# Patient Record
Sex: Female | Born: 1945
Health system: Southern US, Community
[De-identification: ages and names within clinical notes are randomized; demographics above are authoritative.]

## PROBLEM LIST (undated history)

## (undated) DIAGNOSIS — I10 Essential (primary) hypertension: Secondary | ICD-10-CM

## (undated) DIAGNOSIS — E119 Type 2 diabetes mellitus without complications: Secondary | ICD-10-CM

## (undated) HISTORY — PX: ABDOMINAL HYSTERECTOMY: SHX81

---

## 2011-08-05 ENCOUNTER — Other Ambulatory Visit: Payer: Self-pay | Admitting: Nephrology

## 2011-08-05 DIAGNOSIS — N183 Chronic kidney disease, stage 3 unspecified: Secondary | ICD-10-CM

## 2011-08-09 ENCOUNTER — Other Ambulatory Visit: Payer: Self-pay

## 2011-08-18 ENCOUNTER — Ambulatory Visit
Admission: RE | Admit: 2011-08-18 | Discharge: 2011-08-18 | Disposition: A | Discharge status: Home / Self Care | Payer: Medicare Other | Source: Ambulatory Visit | Attending: Nephrology | Admitting: Nephrology

## 2011-08-18 DIAGNOSIS — N183 Chronic kidney disease, stage 3 unspecified: Secondary | ICD-10-CM

## 2014-06-19 DIAGNOSIS — N183 Chronic kidney disease, stage 3 (moderate): Secondary | ICD-10-CM | POA: Diagnosis not present

## 2014-06-19 DIAGNOSIS — E1165 Type 2 diabetes mellitus with hyperglycemia: Secondary | ICD-10-CM | POA: Diagnosis not present

## 2014-06-19 DIAGNOSIS — E785 Hyperlipidemia, unspecified: Secondary | ICD-10-CM | POA: Diagnosis not present

## 2014-06-19 DIAGNOSIS — I10 Essential (primary) hypertension: Secondary | ICD-10-CM | POA: Diagnosis not present

## 2014-08-27 DIAGNOSIS — R000 Tachycardia, unspecified: Secondary | ICD-10-CM | POA: Diagnosis not present

## 2014-08-27 DIAGNOSIS — I10 Essential (primary) hypertension: Secondary | ICD-10-CM | POA: Diagnosis not present

## 2014-08-27 DIAGNOSIS — N183 Chronic kidney disease, stage 3 (moderate): Secondary | ICD-10-CM | POA: Diagnosis not present

## 2015-01-27 DIAGNOSIS — E1165 Type 2 diabetes mellitus with hyperglycemia: Secondary | ICD-10-CM | POA: Diagnosis not present

## 2015-01-27 DIAGNOSIS — I10 Essential (primary) hypertension: Secondary | ICD-10-CM | POA: Diagnosis not present

## 2015-01-27 DIAGNOSIS — E785 Hyperlipidemia, unspecified: Secondary | ICD-10-CM | POA: Diagnosis not present

## 2015-01-27 DIAGNOSIS — J45909 Unspecified asthma, uncomplicated: Secondary | ICD-10-CM | POA: Diagnosis not present

## 2015-11-18 DIAGNOSIS — H401130 Primary open-angle glaucoma, bilateral, stage unspecified: Secondary | ICD-10-CM | POA: Diagnosis not present

## 2015-11-19 DIAGNOSIS — R000 Tachycardia, unspecified: Secondary | ICD-10-CM | POA: Diagnosis not present

## 2015-11-19 DIAGNOSIS — N183 Chronic kidney disease, stage 3 (moderate): Secondary | ICD-10-CM | POA: Diagnosis not present

## 2015-11-19 DIAGNOSIS — I10 Essential (primary) hypertension: Secondary | ICD-10-CM | POA: Diagnosis not present

## 2016-01-27 DIAGNOSIS — E785 Hyperlipidemia, unspecified: Secondary | ICD-10-CM | POA: Diagnosis not present

## 2016-01-27 DIAGNOSIS — E1165 Type 2 diabetes mellitus with hyperglycemia: Secondary | ICD-10-CM | POA: Diagnosis not present

## 2016-01-27 DIAGNOSIS — M109 Gout, unspecified: Secondary | ICD-10-CM | POA: Diagnosis not present

## 2016-01-27 DIAGNOSIS — Z1211 Encounter for screening for malignant neoplasm of colon: Secondary | ICD-10-CM | POA: Diagnosis not present

## 2016-01-27 DIAGNOSIS — I10 Essential (primary) hypertension: Secondary | ICD-10-CM | POA: Diagnosis not present

## 2016-02-01 DIAGNOSIS — D210 Benign neoplasm of connective and other soft tissue of head, face and neck: Secondary | ICD-10-CM | POA: Diagnosis not present

## 2016-02-01 DIAGNOSIS — D485 Neoplasm of uncertain behavior of skin: Secondary | ICD-10-CM | POA: Diagnosis not present

## 2016-02-17 DIAGNOSIS — G629 Polyneuropathy, unspecified: Secondary | ICD-10-CM | POA: Diagnosis not present

## 2016-02-17 DIAGNOSIS — R000 Tachycardia, unspecified: Secondary | ICD-10-CM | POA: Diagnosis not present

## 2016-02-17 DIAGNOSIS — I10 Essential (primary) hypertension: Secondary | ICD-10-CM | POA: Diagnosis not present

## 2016-02-17 DIAGNOSIS — N183 Chronic kidney disease, stage 3 (moderate): Secondary | ICD-10-CM | POA: Diagnosis not present

## 2016-05-05 DIAGNOSIS — D485 Neoplasm of uncertain behavior of skin: Secondary | ICD-10-CM | POA: Diagnosis not present

## 2016-05-10 DIAGNOSIS — D2312 Other benign neoplasm of skin of left eyelid, including canthus: Secondary | ICD-10-CM | POA: Diagnosis not present

## 2016-08-18 DIAGNOSIS — R000 Tachycardia, unspecified: Secondary | ICD-10-CM | POA: Diagnosis not present

## 2016-08-18 DIAGNOSIS — N183 Chronic kidney disease, stage 3 (moderate): Secondary | ICD-10-CM | POA: Diagnosis not present

## 2016-08-18 DIAGNOSIS — M109 Gout, unspecified: Secondary | ICD-10-CM | POA: Diagnosis not present

## 2016-08-18 DIAGNOSIS — G629 Polyneuropathy, unspecified: Secondary | ICD-10-CM | POA: Diagnosis not present

## 2016-08-18 DIAGNOSIS — I10 Essential (primary) hypertension: Secondary | ICD-10-CM | POA: Diagnosis not present

## 2016-09-21 DIAGNOSIS — I10 Essential (primary) hypertension: Secondary | ICD-10-CM | POA: Diagnosis not present

## 2016-09-21 DIAGNOSIS — J45909 Unspecified asthma, uncomplicated: Secondary | ICD-10-CM | POA: Diagnosis not present

## 2016-09-21 DIAGNOSIS — Z1389 Encounter for screening for other disorder: Secondary | ICD-10-CM | POA: Diagnosis not present

## 2016-09-21 DIAGNOSIS — Z0001 Encounter for general adult medical examination with abnormal findings: Secondary | ICD-10-CM | POA: Diagnosis not present

## 2016-09-21 DIAGNOSIS — E785 Hyperlipidemia, unspecified: Secondary | ICD-10-CM | POA: Diagnosis not present

## 2016-09-21 DIAGNOSIS — M79602 Pain in left arm: Secondary | ICD-10-CM | POA: Diagnosis not present

## 2016-09-21 DIAGNOSIS — M109 Gout, unspecified: Secondary | ICD-10-CM | POA: Diagnosis not present

## 2016-09-21 DIAGNOSIS — E1165 Type 2 diabetes mellitus with hyperglycemia: Secondary | ICD-10-CM | POA: Diagnosis not present

## 2016-09-21 DIAGNOSIS — N183 Chronic kidney disease, stage 3 (moderate): Secondary | ICD-10-CM | POA: Diagnosis not present

## 2016-09-23 DIAGNOSIS — M25512 Pain in left shoulder: Secondary | ICD-10-CM | POA: Diagnosis not present

## 2016-09-23 DIAGNOSIS — R202 Paresthesia of skin: Secondary | ICD-10-CM | POA: Diagnosis not present

## 2016-10-05 DIAGNOSIS — M4722 Other spondylosis with radiculopathy, cervical region: Secondary | ICD-10-CM | POA: Diagnosis not present

## 2016-10-05 DIAGNOSIS — M25512 Pain in left shoulder: Secondary | ICD-10-CM | POA: Diagnosis not present

## 2016-10-05 DIAGNOSIS — M542 Cervicalgia: Secondary | ICD-10-CM | POA: Diagnosis not present

## 2016-10-06 DIAGNOSIS — M5412 Radiculopathy, cervical region: Secondary | ICD-10-CM | POA: Diagnosis not present

## 2016-10-10 DIAGNOSIS — M5412 Radiculopathy, cervical region: Secondary | ICD-10-CM | POA: Diagnosis not present

## 2016-10-13 DIAGNOSIS — M5412 Radiculopathy, cervical region: Secondary | ICD-10-CM | POA: Diagnosis not present

## 2016-10-18 DIAGNOSIS — M5412 Radiculopathy, cervical region: Secondary | ICD-10-CM | POA: Diagnosis not present

## 2016-10-27 DIAGNOSIS — M5412 Radiculopathy, cervical region: Secondary | ICD-10-CM | POA: Diagnosis not present

## 2016-12-13 DIAGNOSIS — Z780 Asymptomatic menopausal state: Secondary | ICD-10-CM | POA: Diagnosis not present

## 2017-02-24 DIAGNOSIS — H401134 Primary open-angle glaucoma, bilateral, indeterminate stage: Secondary | ICD-10-CM | POA: Diagnosis not present

## 2017-02-27 DIAGNOSIS — N183 Chronic kidney disease, stage 3 (moderate): Secondary | ICD-10-CM | POA: Diagnosis not present

## 2017-02-27 DIAGNOSIS — I10 Essential (primary) hypertension: Secondary | ICD-10-CM | POA: Diagnosis not present

## 2017-02-27 DIAGNOSIS — M109 Gout, unspecified: Secondary | ICD-10-CM | POA: Diagnosis not present

## 2017-02-27 DIAGNOSIS — R000 Tachycardia, unspecified: Secondary | ICD-10-CM | POA: Diagnosis not present

## 2017-02-27 DIAGNOSIS — G629 Polyneuropathy, unspecified: Secondary | ICD-10-CM | POA: Diagnosis not present

## 2017-11-30 DIAGNOSIS — J45909 Unspecified asthma, uncomplicated: Secondary | ICD-10-CM | POA: Diagnosis not present

## 2017-11-30 DIAGNOSIS — I10 Essential (primary) hypertension: Secondary | ICD-10-CM | POA: Diagnosis not present

## 2017-11-30 DIAGNOSIS — E785 Hyperlipidemia, unspecified: Secondary | ICD-10-CM | POA: Diagnosis not present

## 2017-11-30 DIAGNOSIS — M109 Gout, unspecified: Secondary | ICD-10-CM | POA: Diagnosis not present

## 2017-11-30 DIAGNOSIS — Z1211 Encounter for screening for malignant neoplasm of colon: Secondary | ICD-10-CM | POA: Diagnosis not present

## 2017-11-30 DIAGNOSIS — E1169 Type 2 diabetes mellitus with other specified complication: Secondary | ICD-10-CM | POA: Diagnosis not present

## 2017-11-30 DIAGNOSIS — N183 Chronic kidney disease, stage 3 (moderate): Secondary | ICD-10-CM | POA: Diagnosis not present

## 2018-02-06 DIAGNOSIS — A084 Viral intestinal infection, unspecified: Secondary | ICD-10-CM | POA: Diagnosis not present

## 2018-02-06 DIAGNOSIS — R51 Headache: Secondary | ICD-10-CM | POA: Diagnosis not present

## 2018-09-20 DIAGNOSIS — Z961 Presence of intraocular lens: Secondary | ICD-10-CM | POA: Diagnosis not present

## 2018-09-20 DIAGNOSIS — E113393 Type 2 diabetes mellitus with moderate nonproliferative diabetic retinopathy without macular edema, bilateral: Secondary | ICD-10-CM | POA: Diagnosis not present

## 2018-11-13 DIAGNOSIS — E113293 Type 2 diabetes mellitus with mild nonproliferative diabetic retinopathy without macular edema, bilateral: Secondary | ICD-10-CM | POA: Diagnosis not present

## 2018-11-13 DIAGNOSIS — H43811 Vitreous degeneration, right eye: Secondary | ICD-10-CM | POA: Diagnosis not present

## 2018-11-13 DIAGNOSIS — H43823 Vitreomacular adhesion, bilateral: Secondary | ICD-10-CM | POA: Diagnosis not present

## 2018-12-03 DIAGNOSIS — J45909 Unspecified asthma, uncomplicated: Secondary | ICD-10-CM | POA: Diagnosis not present

## 2018-12-03 DIAGNOSIS — M109 Gout, unspecified: Secondary | ICD-10-CM | POA: Diagnosis not present

## 2018-12-03 DIAGNOSIS — Z7984 Long term (current) use of oral hypoglycemic drugs: Secondary | ICD-10-CM | POA: Diagnosis not present

## 2018-12-03 DIAGNOSIS — E113293 Type 2 diabetes mellitus with mild nonproliferative diabetic retinopathy without macular edema, bilateral: Secondary | ICD-10-CM | POA: Diagnosis not present

## 2018-12-03 DIAGNOSIS — E1169 Type 2 diabetes mellitus with other specified complication: Secondary | ICD-10-CM | POA: Diagnosis not present

## 2018-12-03 DIAGNOSIS — I10 Essential (primary) hypertension: Secondary | ICD-10-CM | POA: Diagnosis not present

## 2018-12-03 DIAGNOSIS — N183 Chronic kidney disease, stage 3 (moderate): Secondary | ICD-10-CM | POA: Diagnosis not present

## 2018-12-03 DIAGNOSIS — E785 Hyperlipidemia, unspecified: Secondary | ICD-10-CM | POA: Diagnosis not present

## 2018-12-04 DIAGNOSIS — E785 Hyperlipidemia, unspecified: Secondary | ICD-10-CM | POA: Diagnosis not present

## 2018-12-18 DIAGNOSIS — Z1231 Encounter for screening mammogram for malignant neoplasm of breast: Secondary | ICD-10-CM | POA: Diagnosis not present

## 2018-12-18 DIAGNOSIS — Z0000 Encounter for general adult medical examination without abnormal findings: Secondary | ICD-10-CM | POA: Diagnosis not present

## 2018-12-18 DIAGNOSIS — Z1389 Encounter for screening for other disorder: Secondary | ICD-10-CM | POA: Diagnosis not present

## 2018-12-18 DIAGNOSIS — G4700 Insomnia, unspecified: Secondary | ICD-10-CM | POA: Diagnosis not present

## 2019-04-24 DIAGNOSIS — Z7984 Long term (current) use of oral hypoglycemic drugs: Secondary | ICD-10-CM | POA: Diagnosis not present

## 2019-04-24 DIAGNOSIS — E1165 Type 2 diabetes mellitus with hyperglycemia: Secondary | ICD-10-CM | POA: Diagnosis not present

## 2019-04-24 DIAGNOSIS — I10 Essential (primary) hypertension: Secondary | ICD-10-CM | POA: Diagnosis not present

## 2019-05-09 DIAGNOSIS — H43811 Vitreous degeneration, right eye: Secondary | ICD-10-CM | POA: Diagnosis not present

## 2019-05-13 DIAGNOSIS — J45909 Unspecified asthma, uncomplicated: Secondary | ICD-10-CM | POA: Diagnosis not present

## 2019-05-13 DIAGNOSIS — I10 Essential (primary) hypertension: Secondary | ICD-10-CM | POA: Diagnosis not present

## 2019-05-13 DIAGNOSIS — G4700 Insomnia, unspecified: Secondary | ICD-10-CM | POA: Diagnosis not present

## 2019-05-13 DIAGNOSIS — N1832 Chronic kidney disease, stage 3b: Secondary | ICD-10-CM | POA: Diagnosis not present

## 2019-05-13 DIAGNOSIS — E1169 Type 2 diabetes mellitus with other specified complication: Secondary | ICD-10-CM | POA: Diagnosis not present

## 2019-05-13 DIAGNOSIS — E785 Hyperlipidemia, unspecified: Secondary | ICD-10-CM | POA: Diagnosis not present

## 2019-06-06 DIAGNOSIS — H401131 Primary open-angle glaucoma, bilateral, mild stage: Secondary | ICD-10-CM | POA: Diagnosis not present

## 2019-09-05 DIAGNOSIS — R112 Nausea with vomiting, unspecified: Secondary | ICD-10-CM | POA: Diagnosis not present

## 2019-09-05 DIAGNOSIS — R634 Abnormal weight loss: Secondary | ICD-10-CM | POA: Diagnosis not present

## 2019-09-10 ENCOUNTER — Other Ambulatory Visit: Payer: Self-pay | Admitting: Physician Assistant

## 2019-09-10 DIAGNOSIS — R112 Nausea with vomiting, unspecified: Secondary | ICD-10-CM | POA: Diagnosis not present

## 2019-09-10 DIAGNOSIS — Z1211 Encounter for screening for malignant neoplasm of colon: Secondary | ICD-10-CM | POA: Diagnosis not present

## 2019-09-10 DIAGNOSIS — R945 Abnormal results of liver function studies: Secondary | ICD-10-CM | POA: Diagnosis not present

## 2019-09-10 DIAGNOSIS — R6881 Early satiety: Secondary | ICD-10-CM | POA: Diagnosis not present

## 2019-09-10 DIAGNOSIS — R630 Anorexia: Secondary | ICD-10-CM | POA: Diagnosis not present

## 2019-09-10 DIAGNOSIS — K219 Gastro-esophageal reflux disease without esophagitis: Secondary | ICD-10-CM | POA: Diagnosis not present

## 2019-09-10 DIAGNOSIS — R634 Abnormal weight loss: Secondary | ICD-10-CM | POA: Diagnosis not present

## 2019-09-11 ENCOUNTER — Other Ambulatory Visit: Payer: Self-pay | Admitting: Family Medicine

## 2019-09-11 DIAGNOSIS — Z1231 Encounter for screening mammogram for malignant neoplasm of breast: Secondary | ICD-10-CM

## 2019-09-12 DIAGNOSIS — R945 Abnormal results of liver function studies: Secondary | ICD-10-CM | POA: Diagnosis not present

## 2019-09-12 DIAGNOSIS — Z1159 Encounter for screening for other viral diseases: Secondary | ICD-10-CM | POA: Diagnosis not present

## 2019-09-16 ENCOUNTER — Ambulatory Visit
Admission: RE | Admit: 2019-09-16 | Discharge: 2019-09-16 | Disposition: A | Discharge status: Home / Self Care | Payer: Medicare HMO | Source: Ambulatory Visit | Attending: Physician Assistant | Admitting: Physician Assistant

## 2019-09-16 DIAGNOSIS — K7689 Other specified diseases of liver: Secondary | ICD-10-CM | POA: Diagnosis not present

## 2019-09-16 DIAGNOSIS — R112 Nausea with vomiting, unspecified: Secondary | ICD-10-CM

## 2019-09-17 DIAGNOSIS — K2930 Chronic superficial gastritis without bleeding: Secondary | ICD-10-CM | POA: Diagnosis not present

## 2019-09-17 DIAGNOSIS — K222 Esophageal obstruction: Secondary | ICD-10-CM | POA: Diagnosis not present

## 2019-09-17 DIAGNOSIS — R634 Abnormal weight loss: Secondary | ICD-10-CM | POA: Diagnosis not present

## 2019-09-17 DIAGNOSIS — R112 Nausea with vomiting, unspecified: Secondary | ICD-10-CM | POA: Diagnosis not present

## 2019-09-19 DIAGNOSIS — I10 Essential (primary) hypertension: Secondary | ICD-10-CM | POA: Diagnosis not present

## 2019-09-19 DIAGNOSIS — M109 Gout, unspecified: Secondary | ICD-10-CM | POA: Diagnosis not present

## 2019-09-19 DIAGNOSIS — E113293 Type 2 diabetes mellitus with mild nonproliferative diabetic retinopathy without macular edema, bilateral: Secondary | ICD-10-CM | POA: Diagnosis not present

## 2019-09-19 DIAGNOSIS — E119 Type 2 diabetes mellitus without complications: Secondary | ICD-10-CM | POA: Diagnosis not present

## 2019-09-19 DIAGNOSIS — R1114 Bilious vomiting: Secondary | ICD-10-CM | POA: Diagnosis not present

## 2019-09-19 DIAGNOSIS — R94120 Abnormal auditory function study: Secondary | ICD-10-CM | POA: Diagnosis not present

## 2019-09-19 DIAGNOSIS — K219 Gastro-esophageal reflux disease without esophagitis: Secondary | ICD-10-CM | POA: Diagnosis not present

## 2019-09-19 DIAGNOSIS — E1165 Type 2 diabetes mellitus with hyperglycemia: Secondary | ICD-10-CM | POA: Diagnosis not present

## 2019-09-19 DIAGNOSIS — E785 Hyperlipidemia, unspecified: Secondary | ICD-10-CM | POA: Diagnosis not present

## 2019-09-24 ENCOUNTER — Ambulatory Visit
Admission: RE | Admit: 2019-09-24 | Discharge: 2019-09-24 | Disposition: A | Discharge status: Home / Self Care | Payer: Medicare HMO | Source: Ambulatory Visit | Attending: Family Medicine | Admitting: Family Medicine

## 2019-09-24 ENCOUNTER — Other Ambulatory Visit: Payer: Self-pay

## 2019-09-24 DIAGNOSIS — K2930 Chronic superficial gastritis without bleeding: Secondary | ICD-10-CM | POA: Diagnosis not present

## 2019-09-24 DIAGNOSIS — Z1231 Encounter for screening mammogram for malignant neoplasm of breast: Secondary | ICD-10-CM | POA: Diagnosis not present

## 2019-10-04 DIAGNOSIS — K219 Gastro-esophageal reflux disease without esophagitis: Secondary | ICD-10-CM | POA: Diagnosis not present

## 2019-10-04 DIAGNOSIS — I10 Essential (primary) hypertension: Secondary | ICD-10-CM | POA: Diagnosis not present

## 2019-10-04 DIAGNOSIS — R1114 Bilious vomiting: Secondary | ICD-10-CM | POA: Diagnosis not present

## 2019-10-04 DIAGNOSIS — E119 Type 2 diabetes mellitus without complications: Secondary | ICD-10-CM | POA: Diagnosis not present

## 2019-10-08 DIAGNOSIS — R109 Unspecified abdominal pain: Secondary | ICD-10-CM | POA: Diagnosis not present

## 2019-10-08 DIAGNOSIS — R9389 Abnormal findings on diagnostic imaging of other specified body structures: Secondary | ICD-10-CM | POA: Diagnosis not present

## 2019-10-08 DIAGNOSIS — R7989 Other specified abnormal findings of blood chemistry: Secondary | ICD-10-CM | POA: Diagnosis not present

## 2019-10-08 DIAGNOSIS — R112 Nausea with vomiting, unspecified: Secondary | ICD-10-CM | POA: Diagnosis not present

## 2019-10-11 DIAGNOSIS — R9389 Abnormal findings on diagnostic imaging of other specified body structures: Secondary | ICD-10-CM | POA: Diagnosis not present

## 2019-10-11 DIAGNOSIS — R7989 Other specified abnormal findings of blood chemistry: Secondary | ICD-10-CM | POA: Diagnosis not present

## 2019-10-13 ENCOUNTER — Encounter (HOSPITAL_COMMUNITY): Payer: Self-pay

## 2019-10-13 ENCOUNTER — Emergency Department (HOSPITAL_COMMUNITY)
Admission: EM | Admit: 2019-10-13 | Discharge: 2019-10-13 | Discharge status: Against Medical Advice | Payer: Medicare HMO | Attending: Emergency Medicine | Admitting: Emergency Medicine

## 2019-10-13 ENCOUNTER — Other Ambulatory Visit: Payer: Self-pay

## 2019-10-13 DIAGNOSIS — Z5321 Procedure and treatment not carried out due to patient leaving prior to being seen by health care provider: Secondary | ICD-10-CM | POA: Diagnosis not present

## 2019-10-13 DIAGNOSIS — K5900 Constipation, unspecified: Secondary | ICD-10-CM | POA: Insufficient documentation

## 2019-10-13 HISTORY — DX: Essential (primary) hypertension: I10

## 2019-10-13 HISTORY — DX: Type 2 diabetes mellitus without complications: E11.9

## 2019-10-13 NOTE — ED Triage Notes (Signed)
Pt presents with constipation, LBM 2 days ago, reports her stool is too hard. Taken several OTC remedies with no relief. Daughter gave her an enema before coming here

## 2019-10-13 NOTE — ED Notes (Signed)
Pt states she had good BM while waiting in lobby and no longer needs to be seen.  Encouraged her to return if she needs to be seen again.  Pt thanked me and ambulatory to car with family.  No distress.

## 2019-11-06 DIAGNOSIS — E119 Type 2 diabetes mellitus without complications: Secondary | ICD-10-CM | POA: Diagnosis not present

## 2019-11-08 DIAGNOSIS — K219 Gastro-esophageal reflux disease without esophagitis: Secondary | ICD-10-CM | POA: Diagnosis not present

## 2019-11-08 DIAGNOSIS — E1165 Type 2 diabetes mellitus with hyperglycemia: Secondary | ICD-10-CM | POA: Diagnosis not present

## 2019-11-08 DIAGNOSIS — I10 Essential (primary) hypertension: Secondary | ICD-10-CM | POA: Diagnosis not present

## 2019-11-08 DIAGNOSIS — H6123 Impacted cerumen, bilateral: Secondary | ICD-10-CM | POA: Diagnosis not present

## 2019-11-11 DIAGNOSIS — E119 Type 2 diabetes mellitus without complications: Secondary | ICD-10-CM | POA: Diagnosis not present

## 2019-11-11 DIAGNOSIS — H9311 Tinnitus, right ear: Secondary | ICD-10-CM | POA: Diagnosis not present

## 2019-11-11 DIAGNOSIS — H90A22 Sensorineural hearing loss, unilateral, left ear, with restricted hearing on the contralateral side: Secondary | ICD-10-CM | POA: Diagnosis not present

## 2019-11-11 DIAGNOSIS — H90A31 Mixed conductive and sensorineural hearing loss, unilateral, right ear with restricted hearing on the contralateral side: Secondary | ICD-10-CM | POA: Diagnosis not present

## 2019-11-13 DIAGNOSIS — R112 Nausea with vomiting, unspecified: Secondary | ICD-10-CM | POA: Diagnosis not present

## 2019-11-13 DIAGNOSIS — K3184 Gastroparesis: Secondary | ICD-10-CM | POA: Diagnosis not present

## 2019-11-13 DIAGNOSIS — R9389 Abnormal findings on diagnostic imaging of other specified body structures: Secondary | ICD-10-CM | POA: Diagnosis not present

## 2019-11-13 DIAGNOSIS — R7989 Other specified abnormal findings of blood chemistry: Secondary | ICD-10-CM | POA: Diagnosis not present

## 2019-12-20 DIAGNOSIS — K219 Gastro-esophageal reflux disease without esophagitis: Secondary | ICD-10-CM | POA: Diagnosis not present

## 2019-12-20 DIAGNOSIS — E1165 Type 2 diabetes mellitus with hyperglycemia: Secondary | ICD-10-CM | POA: Diagnosis not present

## 2019-12-20 DIAGNOSIS — I10 Essential (primary) hypertension: Secondary | ICD-10-CM | POA: Diagnosis not present

## 2020-01-29 DIAGNOSIS — R059 Cough, unspecified: Secondary | ICD-10-CM | POA: Diagnosis not present

## 2020-01-29 DIAGNOSIS — I10 Essential (primary) hypertension: Secondary | ICD-10-CM | POA: Diagnosis not present

## 2020-03-13 DIAGNOSIS — Z0000 Encounter for general adult medical examination without abnormal findings: Secondary | ICD-10-CM | POA: Diagnosis not present

## 2020-03-13 DIAGNOSIS — I10 Essential (primary) hypertension: Secondary | ICD-10-CM | POA: Diagnosis not present

## 2020-03-13 DIAGNOSIS — E2839 Other primary ovarian failure: Secondary | ICD-10-CM | POA: Diagnosis not present

## 2020-03-13 DIAGNOSIS — N1832 Chronic kidney disease, stage 3b: Secondary | ICD-10-CM | POA: Diagnosis not present

## 2020-03-13 DIAGNOSIS — E1165 Type 2 diabetes mellitus with hyperglycemia: Secondary | ICD-10-CM | POA: Diagnosis not present

## 2020-03-18 DIAGNOSIS — H401131 Primary open-angle glaucoma, bilateral, mild stage: Secondary | ICD-10-CM | POA: Diagnosis not present

## 2020-10-16 LAB — EXTERNAL GENERIC LAB PROCEDURE: COLOGUARD: NEGATIVE

## 2021-03-01 ENCOUNTER — Other Ambulatory Visit: Payer: Self-pay | Admitting: Nephrology

## 2021-03-01 DIAGNOSIS — N1832 Chronic kidney disease, stage 3b: Secondary | ICD-10-CM

## 2021-03-03 IMAGING — US US ABDOMEN LIMITED
1 series · 14 of 25 positions shown · non-contrast
Comparison: None.

CLINICAL DATA: Nausea and vomiting.

EXAM:
ULTRASOUND ABDOMEN LIMITED RIGHT UPPER QUADRANT

[Series 1: us abdomen limited · 0.17mm/px · 14 of 45 slices shown]
[im 1/45]
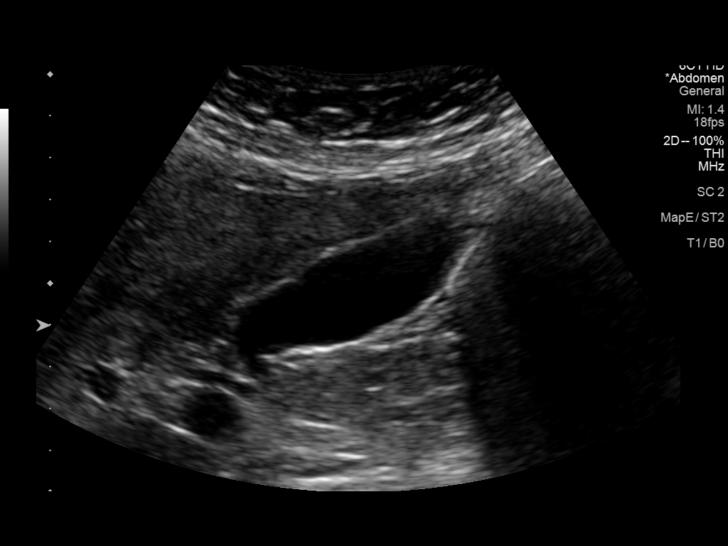
[im 4/45]
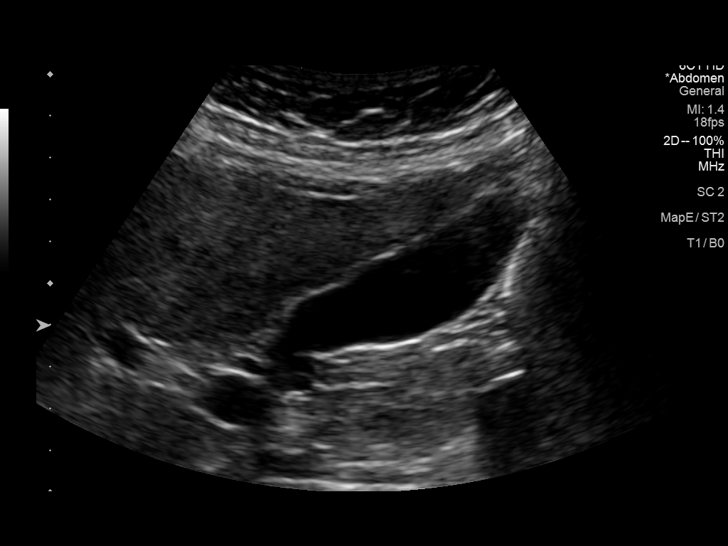
[im 8/45]
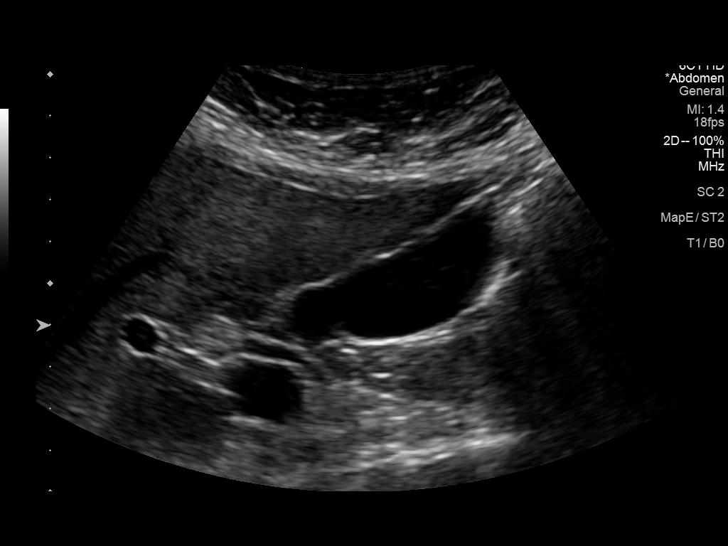
[im 12/45]
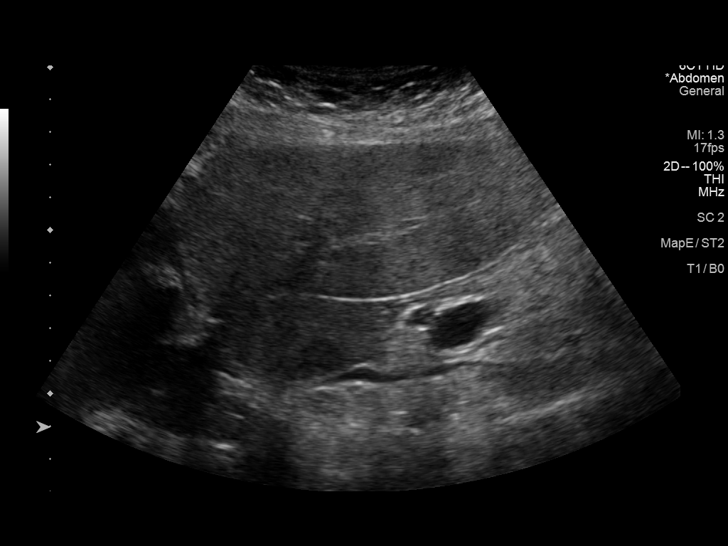
[im 15/45]
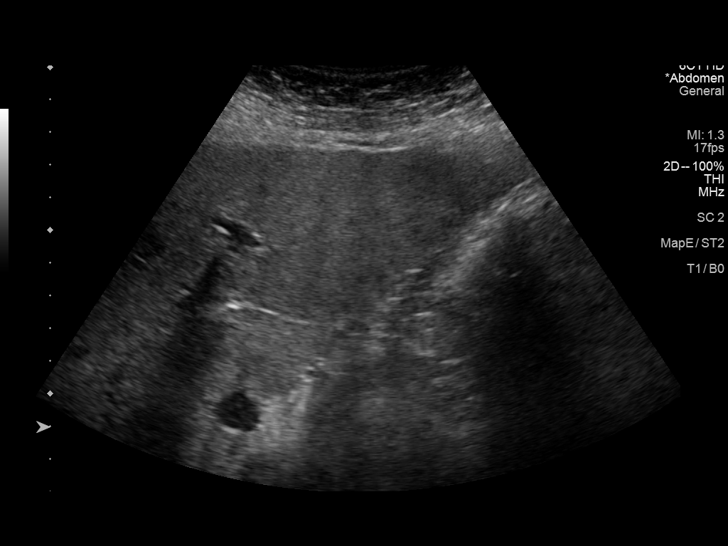
[im 17/45]
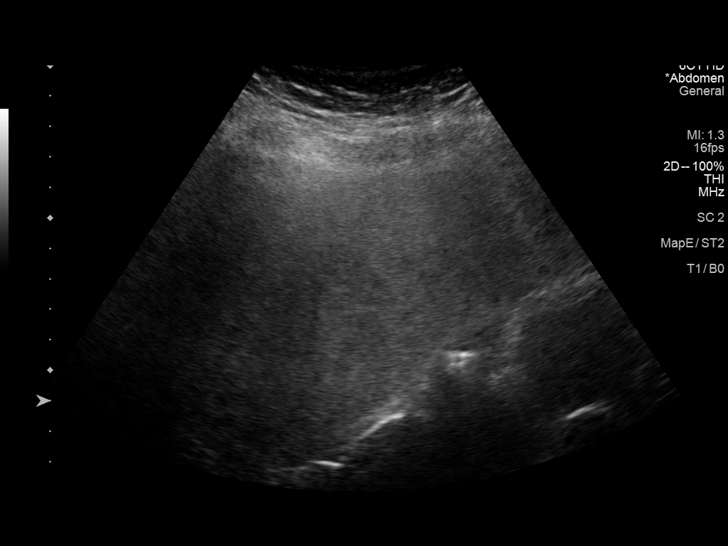
[im 21/45]
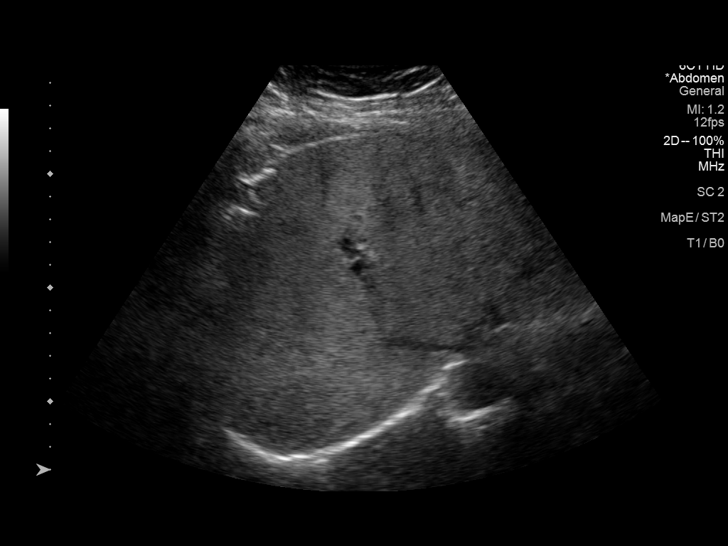
[im 24/45]
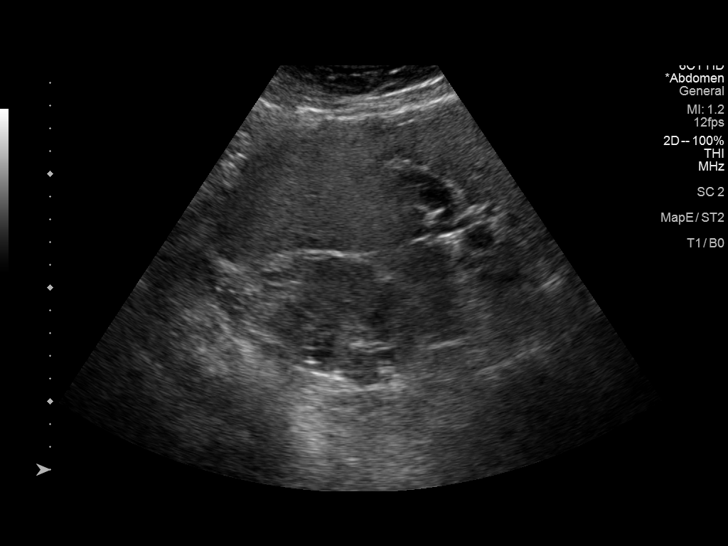
[im 28/45]
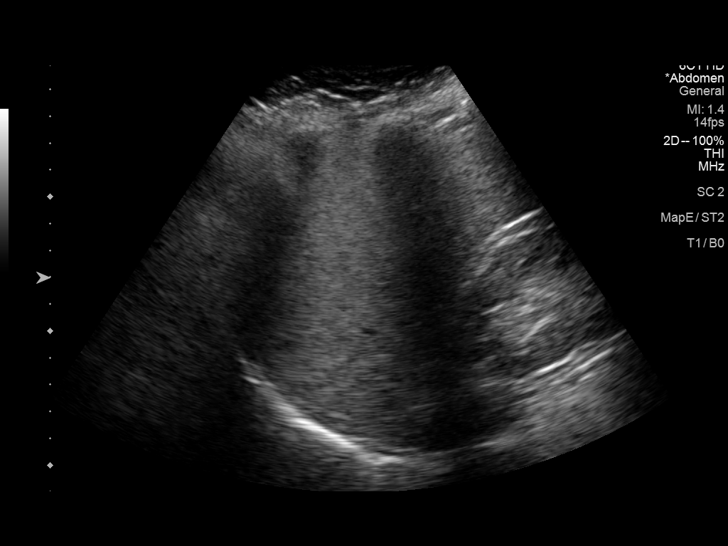
[im 30/45]
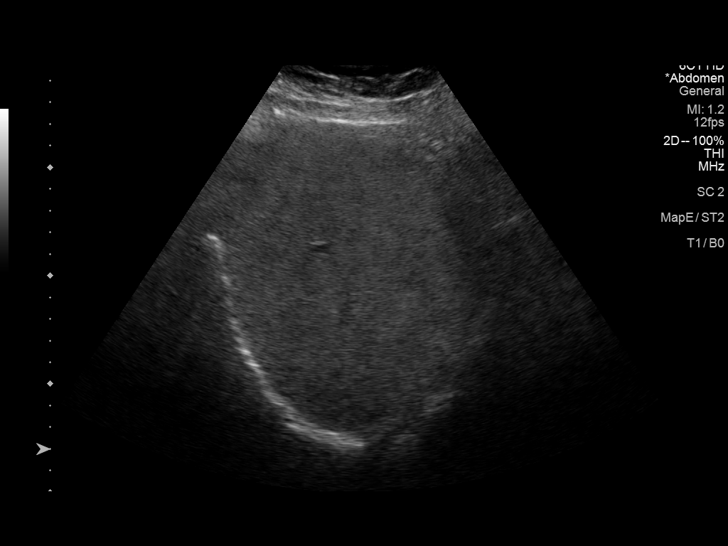
[im 34/45]
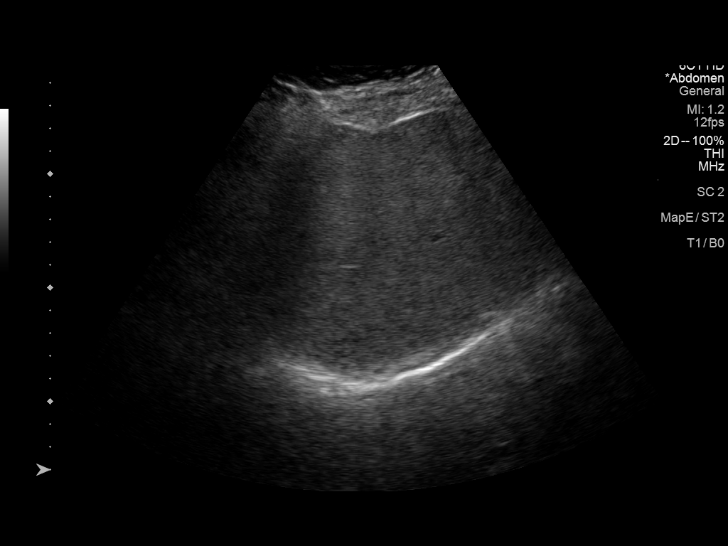
[im 37/45]
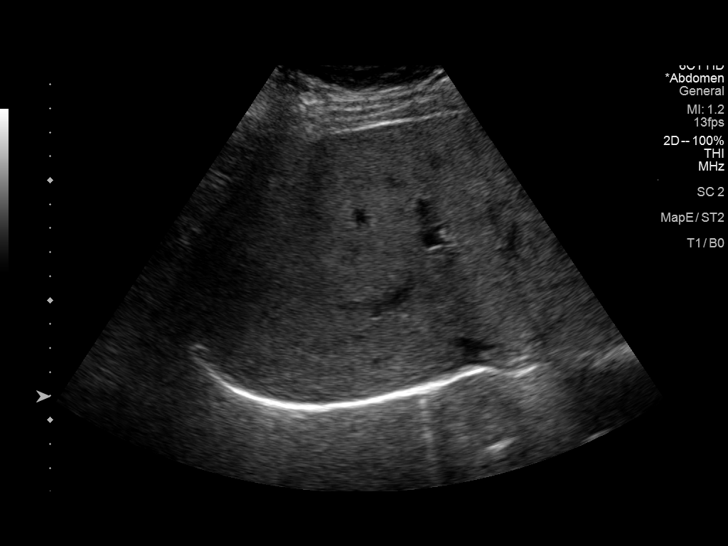
[im 41/45]
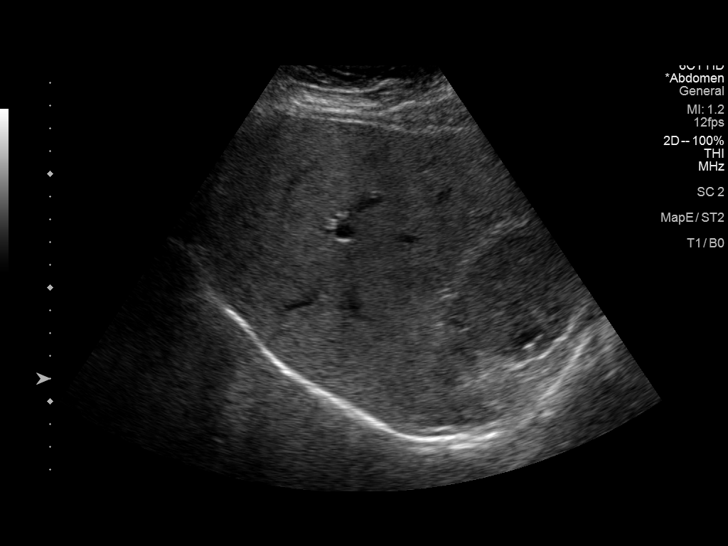
[im 45/45]
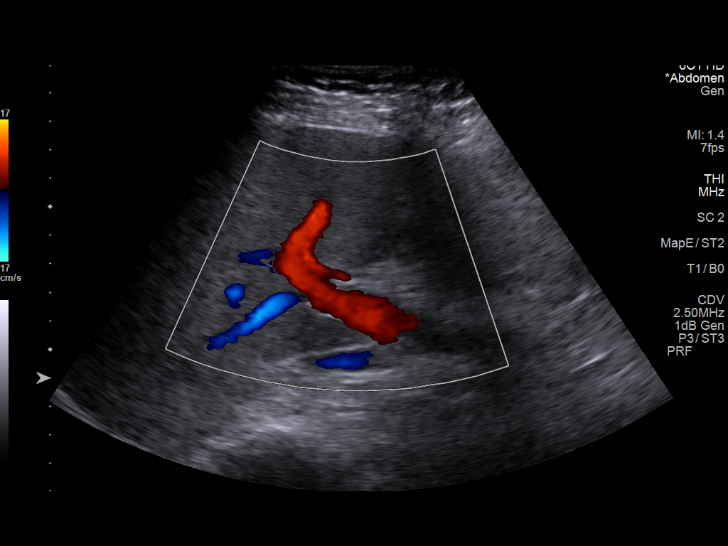

[14 of 25 positions shown; findings below may reference images not displayed]

FINDINGS: Gallbladder:

No gallstones or wall thickening visualized. No sonographic Murphy
sign noted by sonographer.

Common bile duct:

Diameter: Normal, 3 mm.

Liver:

Heterogeneously increased hepatic echogenicity. Portal vein is
patent on color Doppler imaging with normal direction of blood flow
towards the liver.

Other: None.
IMPRESSION: 1.  No acute process or explanation for nausea/vomiting.
2. Heterogeneous hepatic echogenicity, most likely related to
heterogeneous steatosis. Consider further evaluation with pre and
post contrast abdominal MRI, to exclude liver disease, including
cirrhosis or indeterminate liver lesions.

## 2021-03-12 ENCOUNTER — Ambulatory Visit
Admission: RE | Admit: 2021-03-12 | Discharge: 2021-03-12 | Disposition: A | Discharge status: Home / Self Care | Payer: Medicare HMO | Source: Ambulatory Visit | Attending: Nephrology | Admitting: Nephrology

## 2021-03-12 DIAGNOSIS — N1832 Chronic kidney disease, stage 3b: Secondary | ICD-10-CM

## 2021-04-24 ENCOUNTER — Emergency Department (HOSPITAL_BASED_OUTPATIENT_CLINIC_OR_DEPARTMENT_OTHER): Payer: Medicare HMO | Admitting: Radiology

## 2021-04-24 ENCOUNTER — Other Ambulatory Visit: Payer: Self-pay

## 2021-04-24 ENCOUNTER — Emergency Department (HOSPITAL_BASED_OUTPATIENT_CLINIC_OR_DEPARTMENT_OTHER)
Admission: EM | Admit: 2021-04-24 | Discharge: 2021-04-24 | Disposition: A | Discharge status: Home / Self Care | Payer: Medicare HMO | Attending: Emergency Medicine | Admitting: Emergency Medicine

## 2021-04-24 ENCOUNTER — Encounter (HOSPITAL_BASED_OUTPATIENT_CLINIC_OR_DEPARTMENT_OTHER): Payer: Self-pay | Admitting: Emergency Medicine

## 2021-04-24 DIAGNOSIS — I10 Essential (primary) hypertension: Secondary | ICD-10-CM | POA: Insufficient documentation

## 2021-04-24 DIAGNOSIS — M25561 Pain in right knee: Secondary | ICD-10-CM

## 2021-04-24 DIAGNOSIS — Y92410 Unspecified street and highway as the place of occurrence of the external cause: Secondary | ICD-10-CM | POA: Diagnosis not present

## 2021-04-24 DIAGNOSIS — E119 Type 2 diabetes mellitus without complications: Secondary | ICD-10-CM | POA: Diagnosis not present

## 2021-04-24 DIAGNOSIS — G8911 Acute pain due to trauma: Secondary | ICD-10-CM | POA: Insufficient documentation

## 2021-04-24 MED ORDER — DICLOFENAC SODIUM 1 % EX GEL: 4.0000 g | Freq: Four times a day (QID) | CUTANEOUS | 0 refills | Status: AC

## 2021-04-24 MED ORDER — DICLOFENAC SODIUM 1 % EX GEL: 4.0000 g | Freq: Four times a day (QID) | CUTANEOUS | 0 refills | Status: DC

## 2021-04-24 MED ORDER — OXYCODONE HCL 5 MG PO TABS
2.5000 mg | ORAL_TABLET | Freq: Once | ORAL | Status: AC
  Administered 2021-04-24: 2.5 mg via ORAL
  Filled 2021-04-24: qty 1

## 2021-04-24 MED ORDER — ACETAMINOPHEN 500 MG PO TABS
1000.0000 mg | ORAL_TABLET | Freq: Once | ORAL | Status: AC
  Administered 2021-04-24: 1000 mg via ORAL
  Filled 2021-04-24: qty 2
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
## 2021-04-24 NOTE — ED Notes (Signed)
EDP at bedside  

## 2021-04-24 NOTE — Discharge Instructions (Signed)
Use the gel as prescribed.   Also take tylenol 1000mg (2 extra strength) four times a day.     Your xrays did not show a fracture.

## 2023-08-16 ENCOUNTER — Emergency Department (HOSPITAL_BASED_OUTPATIENT_CLINIC_OR_DEPARTMENT_OTHER)
Admission: EM | Admit: 2023-08-16 | Discharge: 2023-08-16 | Disposition: A | Discharge status: Home / Self Care | Attending: Emergency Medicine | Admitting: Emergency Medicine

## 2023-08-16 ENCOUNTER — Emergency Department (HOSPITAL_BASED_OUTPATIENT_CLINIC_OR_DEPARTMENT_OTHER)

## 2023-08-16 ENCOUNTER — Emergency Department (HOSPITAL_BASED_OUTPATIENT_CLINIC_OR_DEPARTMENT_OTHER): Admitting: Radiology

## 2023-08-16 ENCOUNTER — Other Ambulatory Visit: Payer: Self-pay

## 2023-08-16 DIAGNOSIS — I10 Essential (primary) hypertension: Secondary | ICD-10-CM

## 2023-08-16 DIAGNOSIS — N184 Chronic kidney disease, stage 4 (severe): Secondary | ICD-10-CM | POA: Diagnosis not present

## 2023-08-16 DIAGNOSIS — N281 Cyst of kidney, acquired: Secondary | ICD-10-CM | POA: Diagnosis not present

## 2023-08-16 DIAGNOSIS — E1122 Type 2 diabetes mellitus with diabetic chronic kidney disease: Secondary | ICD-10-CM | POA: Insufficient documentation

## 2023-08-16 DIAGNOSIS — N200 Calculus of kidney: Secondary | ICD-10-CM | POA: Diagnosis not present

## 2023-08-16 DIAGNOSIS — R7989 Other specified abnormal findings of blood chemistry: Secondary | ICD-10-CM | POA: Diagnosis not present

## 2023-08-16 DIAGNOSIS — R112 Nausea with vomiting, unspecified: Secondary | ICD-10-CM | POA: Diagnosis present

## 2023-08-16 DIAGNOSIS — I129 Hypertensive chronic kidney disease with stage 1 through stage 4 chronic kidney disease, or unspecified chronic kidney disease: Secondary | ICD-10-CM | POA: Insufficient documentation

## 2023-08-16 DIAGNOSIS — R0602 Shortness of breath: Secondary | ICD-10-CM | POA: Diagnosis present

## 2023-08-16 LAB — CBG MONITORING, ED: Glucose-Capillary: 210 mg/dL — ABNORMAL HIGH (ref 70–99)

## 2023-08-16 LAB — CBC WITH DIFFERENTIAL/PLATELET
Abs Immature Granulocytes: 0.03 10*3/uL (ref 0.00–0.07)
Basophils Absolute: 0.1 10*3/uL (ref 0.0–0.1)
Basophils Relative: 1 %
Eosinophils Absolute: 3.7 10*3/uL — ABNORMAL HIGH (ref 0.0–0.5)
Eosinophils Relative: 38 %
HCT: 41.8 % (ref 36.0–46.0)
Hemoglobin: 14.3 g/dL (ref 12.0–15.0)
Immature Granulocytes: 0 %
Lymphocytes Relative: 21 %
Lymphs Abs: 2 10*3/uL (ref 0.7–4.0)
MCH: 25.8 pg — ABNORMAL LOW (ref 26.0–34.0)
MCHC: 34.2 g/dL (ref 30.0–36.0)
MCV: 75.5 fL — ABNORMAL LOW (ref 80.0–100.0)
Monocytes Absolute: 0.8 10*3/uL (ref 0.1–1.0)
Monocytes Relative: 8 %
Neutro Abs: 3.1 10*3/uL (ref 1.7–7.7)
Neutrophils Relative %: 32 %
Platelets: 321 10*3/uL (ref 150–400)
RBC: 5.54 MIL/uL — ABNORMAL HIGH (ref 3.87–5.11)
RDW: 15.9 % — ABNORMAL HIGH (ref 11.5–15.5)
WBC: 9.7 10*3/uL (ref 4.0–10.5)
nRBC: 0 % (ref 0.0–0.2)

## 2023-08-16 LAB — COMPREHENSIVE METABOLIC PANEL WITH GFR
ALT: 41 U/L (ref 0–44)
AST: 35 U/L (ref 15–41)
Albumin: 4.8 g/dL (ref 3.5–5.0)
Alkaline Phosphatase: 86 U/L (ref 38–126)
Anion gap: 19 — ABNORMAL HIGH (ref 5–15)
BUN: 33 mg/dL — ABNORMAL HIGH (ref 8–23)
CO2: 21 mmol/L — ABNORMAL LOW (ref 22–32)
Calcium: 10.6 mg/dL — ABNORMAL HIGH (ref 8.9–10.3)
Chloride: 100 mmol/L (ref 98–111)
Creatinine, Ser: 1.99 mg/dL — ABNORMAL HIGH (ref 0.44–1.00)
GFR, Estimated: 25 mL/min — ABNORMAL LOW (ref >60)
Glucose, Bld: 230 mg/dL — ABNORMAL HIGH (ref 70–99)
Potassium: 3.6 mmol/L (ref 3.5–5.1)
Sodium: 140 mmol/L (ref 135–145)
Total Bilirubin: 0.5 mg/dL (ref 0.0–1.2)
Total Protein: 8.5 g/dL — ABNORMAL HIGH (ref 6.5–8.1)

## 2023-08-16 LAB — I-STAT VENOUS BLOOD GAS, ED
Acid-base deficit: 2 mmol/L (ref 0.0–2.0)
Bicarbonate: 22.7 mmol/L (ref 20.0–28.0)
Calcium, Ion: 1.24 mmol/L (ref 1.15–1.40)
HCT: 41 % (ref 36.0–46.0)
Hemoglobin: 13.9 g/dL (ref 12.0–15.0)
O2 Saturation: 85 %
Patient temperature: 98.6
Potassium: 3.4 mmol/L — ABNORMAL LOW (ref 3.5–5.1)
Sodium: 141 mmol/L (ref 135–145)
TCO2: 24 mmol/L (ref 22–32)
pCO2, Ven: 39.2 mmHg — ABNORMAL LOW (ref 44–60)
pH, Ven: 7.37 (ref 7.25–7.43)
pO2, Ven: 52 mmHg — ABNORMAL HIGH (ref 32–45)

## 2023-08-16 LAB — LACTIC ACID, PLASMA: Lactic Acid, Venous: 1.8 mmol/L (ref 0.5–1.9)

## 2023-08-16 LAB — URINALYSIS, ROUTINE W REFLEX MICROSCOPIC
Bacteria, UA: NONE SEEN
Bilirubin Urine: NEGATIVE
Glucose, UA: >1000 mg/dL — AB
Nitrite: NEGATIVE
Protein, ur: >300 mg/dL — AB
Specific Gravity, Urine: 1.029 (ref 1.005–1.030)
pH: 6.5 (ref 5.0–8.0)

## 2023-08-16 LAB — PRO BRAIN NATRIURETIC PEPTIDE: Pro Brain Natriuretic Peptide: 916 pg/mL — ABNORMAL HIGH (ref <300.0)

## 2023-08-16 LAB — BETA-HYDROXYBUTYRIC ACID: Beta-Hydroxybutyric Acid: 1.11 mmol/L — ABNORMAL HIGH (ref 0.05–0.27)

## 2023-08-16 LAB — TROPONIN T, HIGH SENSITIVITY
Troponin T High Sensitivity: 20 ng/L — ABNORMAL HIGH (ref <19)
Troponin T High Sensitivity: 19 ng/L — ABNORMAL HIGH

## 2023-08-16 LAB — LIPASE, BLOOD: Lipase: 39 U/L (ref 11–51)

## 2023-08-16 MED ORDER — DIPHENHYDRAMINE HCL 50 MG/ML IJ SOLN
25.0000 mg | Freq: Once | INTRAMUSCULAR | Status: AC
  Administered 2023-08-16: 25 mg via INTRAVENOUS
  Filled 2023-08-16: qty 1

## 2023-08-16 MED ORDER — METOCLOPRAMIDE HCL 5 MG/ML IJ SOLN
10.0000 mg | Freq: Once | INTRAMUSCULAR | Status: AC
  Administered 2023-08-16: 10 mg via INTRAVENOUS
  Filled 2023-08-16: qty 2

## 2023-08-16 MED ORDER — METOPROLOL SUCCINATE ER 25 MG PO TB24
100.0000 mg | ORAL_TABLET | Freq: Every day | ORAL | Status: DC
  Administered 2023-08-16: 100 mg via ORAL
  Filled 2023-08-16: qty 4

## 2023-08-16 MED ORDER — SODIUM CHLORIDE 0.9 % IV BOLUS
500.0000 mL | Freq: Once | INTRAVENOUS | Status: AC
Start: 2023-08-16 — End: 2023-08-16
  Administered 2023-08-16: 500 mL via INTRAVENOUS

## 2023-08-16 MED ORDER — METOCLOPRAMIDE HCL 10 MG PO TABS: 10.0000 mg | ORAL_TABLET | Freq: Four times a day (QID) | ORAL | 0 refills | Status: AC | PRN

## 2023-08-16 MED ORDER — LABETALOL HCL 5 MG/ML IV SOLN
10.0000 mg | Freq: Once | INTRAVENOUS | Status: AC
  Administered 2023-08-16: 10 mg via INTRAVENOUS
  Filled 2023-08-16: qty 4

## 2023-08-16 MED ORDER — ONDANSETRON HCL 4 MG/2ML IJ SOLN
4.0000 mg | Freq: Once | INTRAMUSCULAR | Status: AC
  Administered 2023-08-16: 4 mg via INTRAVENOUS
  Filled 2023-08-16: qty 2

## 2023-08-16 MED ORDER — AMLODIPINE BESYLATE 5 MG PO TABS
10.0000 mg | ORAL_TABLET | Freq: Once | ORAL | Status: AC
Start: 2023-08-16 — End: 2023-08-16
  Administered 2023-08-16: 10 mg via ORAL
  Filled 2023-08-16: qty 2

## 2023-08-16 NOTE — ED Triage Notes (Signed)
 In for eval of cold symptoms then started vomiting approx 2 days ago. Unable to keep food or water down. Denies abd pain or diarrhea.

## 2023-08-16 NOTE — ED Notes (Addendum)
 Pt ambulated to restroom by NT, pt accidentally removed IV in restroom. IV site bleeding, gauze and coban applied. Pt given PO meds with water, immediately vomited after admin. EDP notified.

## 2023-08-16 NOTE — ED Notes (Signed)
 Pt tolerating PO challenge appropriately at this time.

## 2023-08-16 NOTE — ED Notes (Signed)
 Patient refuses to have fall band placed or fall sign on the door.

## 2023-08-16 NOTE — ED Notes (Signed)
 RN called lab to confirm Troponin could be added to 930 blood draw.

## 2023-08-16 NOTE — ED Notes (Signed)
Spoke with lab about Troponin.

## 2023-08-16 NOTE — ED Provider Notes (Signed)
 [place] EMERGENCY DEPARTMENT AT [facility] Provider Note   CSN: [id number] Arrival date & time: 08/16/23  0734     History  Chief Complaint  Patient presents with   Emesis    Brooke Foster is a 78 y.o. female.  HPI     78 year old female with a history of type 2 diabetes, hypertension, CKD stage IV, hyperlipidemia who presents with concerns for nausea and vomiting.  Reports for the last 2 days she has had nausea and vomiting.  She initially had diarrhea 5 or 6 times on Monday, but since then has not been able to keep anything down and has not had any bowel movements.  She has passed flatus.  She denies any significant abdominal pain, just reports nausea, vomiting, inability to eat or drink anything or take her regular medications.  Denies fever, dysuria, head trauma or headache, chest pain, cough.  She has had some shortness of breath.  Her daughter does give her a medication at night that might contain THC to help her sleep.  She does not take NSAIDs or drink alcohol.    Has had prior gastroenterology follow-up for elevated liver enzymes with ultrasound showing possible cirrhosis, hepatitis panel was negative.  EGD in outside practice in May 2021 showed a Schatzki's ring and hiatal hernia with food in the stomach, was seen in the GI clinic in 2021 with ongoing nausea, vomiting secondary to gastroparesis and was seen again November 2020 for for concern of worsening nausea and vomiting. Past Medical History:  Diagnosis Date   Diabetes mellitus without complication (HCC)    Hypertension      Home Medications Prior to Admission medications   Medication Sig Start Date End Date Taking? Authorizing Provider  metoCLOPramide  (REGLAN ) 10 MG tablet Take 1 tablet (10 mg total) by mouth every 6 (six) hours as needed for nausea or vomiting. 08/16/23  Yes Scarlette Currier, MD  diclofenac  Sodium (VOLTAREN ) 1 % GEL Apply 4 g topically 4 (four) times daily. 04/24/21   Albertus Hughs, DO       Allergies    Allopurinol    Review of Systems   Review of Systems  Physical Exam Updated Vital Signs BP (!) 179/85   Pulse 96   Temp 98.7 F (37.1 C)   Resp 16   Ht 5\' 3"  (1.6 m)   Wt 72.6 kg   SpO2 99%   BMI 28.34 kg/m  Physical Exam Vitals and nursing note reviewed.  Constitutional:      General: She is not in acute distress.    Appearance: She is well-developed. She is not diaphoretic.  HENT:     Head: Normocephalic and atraumatic.  Eyes:     Conjunctiva/sclera: Conjunctivae normal.  Cardiovascular:     Rate and Rhythm: Normal rate and regular rhythm.     Heart sounds: Normal heart sounds. No murmur heard.    No friction rub. No gallop.  Pulmonary:     Effort: Pulmonary effort is normal. No respiratory distress.     Breath sounds: Normal breath sounds. No wheezing or rales.  Abdominal:     General: There is no distension.     Palpations: Abdomen is soft.     Tenderness: There is no abdominal tenderness. There is no guarding.  Musculoskeletal:        General: No tenderness.     Cervical back: Normal range of motion.  Skin:    General: Skin is warm and dry.     Findings:  No erythema or rash.  Neurological:     Mental Status: She is alert and oriented to person, place, and time.     ED Results / Procedures / Treatments   Labs (all labs ordered are listed, but only abnormal results are displayed) Labs Reviewed  CBC WITH DIFFERENTIAL/PLATELET - Abnormal; Notable for the following components:      Result Value   RBC 5.54 (*)    MCV 75.5 (*)    MCH 25.8 (*)    RDW 15.9 (*)    Eosinophils Absolute 3.7 (*)    All other components within normal limits  URINALYSIS, ROUTINE W REFLEX MICROSCOPIC - Abnormal; Notable for the following components:   Glucose, UA >1,000 (*)    Hgb urine dipstick MODERATE (*)    Ketones, ur TRACE (*)    Protein, ur >300 (*)    Leukocytes,Ua TRACE (*)    All other components within normal limits  BETA-HYDROXYBUTYRIC ACID -  Abnormal; Notable for the following components:   Beta-Hydroxybutyric Acid 1.11 (*)    All other components within normal limits  COMPREHENSIVE METABOLIC PANEL WITH GFR - Abnormal; Notable for the following components:   CO2 21 (*)    Glucose, Bld 230 (*)    BUN 33 (*)    Creatinine, Ser 1.99 (*)    Calcium 10.6 (*)    Total Protein 8.5 (*)    GFR, Estimated 25 (*)    Anion gap 19 (*)    All other components within normal limits  PRO BRAIN NATRIURETIC PEPTIDE - Abnormal; Notable for the following components:   Pro Brain Natriuretic Peptide 916.0 (*)    All other components within normal limits  I-STAT VENOUS BLOOD GAS, ED - Abnormal; Notable for the following components:   pCO2, Ven 39.2 (*)    pO2, Ven 52 (*)    Potassium 3.4 (*)    All other components within normal limits  CBG MONITORING, ED - Abnormal; Notable for the following components:   Glucose-Capillary 210 (*)    All other components within normal limits  TROPONIN T, HIGH SENSITIVITY - Abnormal; Notable for the following components:   Troponin T High Sensitivity 20 (*)    All other components within normal limits  TROPONIN T, HIGH SENSITIVITY - Abnormal; Notable for the following components:   Troponin T High Sensitivity 19 (*)    All other components within normal limits  URINE CULTURE  LACTIC ACID, PLASMA  LIPASE, BLOOD  I-STAT VENOUS BLOOD GAS, ED    EKG EKG Interpretation Date/Time:  Wednesday August 16 2023 10:32:56 EDT Ventricular Rate:  99 PR Interval:  153 QRS Duration:  87 QT Interval:  341 QTC Calculation: 438 R Axis:   73  Text Interpretation: Sinus rhythm Consider left atrial enlargement Consider left ventricular hypertrophy Baseline wander in lead(s) V2 No previous ECGs available Confirmed by Scarlette Currier ([id number]) on 08/16/2023 11:43:32 AM  Radiology CT ABDOMEN PELVIS WO CONTRAST Result Date: 08/16/2023 CLINICAL DATA:  Suspected bowel obstruction EXAM: CT ABDOMEN AND PELVIS WITHOUT CONTRAST  TECHNIQUE: Multidetector CT imaging of the abdomen and pelvis was performed following the standard protocol without IV contrast. RADIATION DOSE REDUCTION: This exam was performed according to the departmental dose-optimization program which includes automated exposure control, adjustment of the mA and/or kV according to patient size and/or use of iterative reconstruction technique. COMPARISON:  None Available. FINDINGS: Lower chest: No infiltrates or consolidations, no pleural effusions Hepatobiliary: Liver normal size no masses no biliary dilatation. Gallbladder unremarkable.  No gallstones. Pancreas: Pancreas normal size. No masses calcifications or inflammatory changes. Spleen: Spleen normal size.  No masses. Adrenals/Urinary Tract: Adrenal glands are normal size. Follow-up recommended. Kidneys are normal. No masses. No hydronephrosis. Several cortical cysts bilaterally. More dominant right kidney upper pole simple cyst of 1.8 x 1.8 cm. Anterior cortical margin right kidney simple cyst of 10 x 10 mm Anterior cortical margin of the lower pole of the right kidney simple cortical cyst of 4.3 x 3.6 mm. 2 mm calcification in the right kidney upper pole calices correlate with lithiasis without hydronephrosis. Stomach/Bowel: No small or large bowel obstruction or inflammatory changes. Moderate amount of residual fecal material throughout the colon without obstruction or constipation. Vascular/Lymphatic: No significant vascular findings are present. No enlarged abdominal or pelvic lymph nodes. Reproductive: Status post hysterectomy. No adnexal masses. Other: Anterior abdominal wall unremarkable without evidence of umbilical or inguinal hernias Musculoskeletal: Visualized portion of the thoracolumbar spine and pelvic structures grossly unremarkable without evidence of fracture bony abnormalities or soft tissue masses. IMPRESSION: *No evidence of bowel obstruction or inflammatory changes. *Moderate amount of residual fecal  material throughout the colon without obstruction or constipation. *Right kidney upper pole simple cyst of 1.8 x 1.8 cm. Anterior cortical margin right kidney simple cyst of 10 x 10 mm. Anterior cortical margin of the lower pole of the right kidney simple cortical cyst of 4.3 x 3.6 mm. 2 mm calcification in the right kidney upper pole calices correlate with lithiasis without hydronephrosis. Electronically Signed   By: Fredrich Jefferson M.D.   On: 08/16/2023 11:58   DG Chest 2 View Result Date: 08/16/2023 CLINICAL DATA:  Shortness of breath EXAM: CHEST - 2 VIEW COMPARISON:  None Available. FINDINGS: Heart and mediastinal contours are within normal limits. No focal opacities or effusions. No acute bony abnormality. Aortic atherosclerosis. IMPRESSION: No active cardiopulmonary disease. Electronically Signed   By: Janeece Mechanic M.D.   On: 08/16/2023 10:22    Procedures Procedures    Medications Ordered in ED Medications  labetalol  (NORMODYNE ) injection 10 mg (10 mg Intravenous Given 08/16/23 1024)  sodium chloride  0.9 % bolus 500 mL (0 mLs Intravenous Stopped 08/16/23 1204)  ondansetron  (ZOFRAN ) injection 4 mg (4 mg Intravenous Given 08/16/23 1024)  amLODipine  (NORVASC ) tablet 10 mg (10 mg Oral Given 08/16/23 1231)  metoCLOPramide  (REGLAN ) injection 10 mg (10 mg Intravenous Given 08/16/23 1323)  diphenhydrAMINE  (BENADRYL ) injection 25 mg (25 mg Intravenous Given 08/16/23 1320)  labetalol  (NORMODYNE ) injection 10 mg (10 mg Intravenous Given 08/16/23 1327)    ED Course/ Medical Decision Making/ A&P                                    78 year old female with a history of type 2 diabetes, hypertension, CKD stage IV, hyperlipidemia who presents with concerns for nausea and vomiting.  Differential diagnosis includes small bowel obstruction, pancreatitis, hepatitis, cholecystitis, colonic obstruction, DKA, urinary tract infection, ACS, CHF.  EKG completed and personally eval and interpreted by me shows a  normal sinus rhythm without significant acute ST changes.  Chest x-ray completed and evaluated by me show no evidence of pulmonary edema, pneumonia, or acute abnormalities.  Labs completed and personally about interpreted by me show no leukocytosis, no anemia.  Troponin is mildly elevated to 20, and is 19 on repeat.  Suspect this is likely secondary to hypertension and is not consistent with ACS.  Pro BNP is 916,  which is not significantly elevated for her age.  Her creatinine is 1.99 with a GFR 25, (1.57, 1.87 in past) anion gap 19 with mildly decreased bicarb likely in the setting of renal failure or possible starvation ketosis.  Lactic acid is normal beta hydroxybutyric acid is mildly elevated however not to degree to suggest DKA. Normal pH on VBG and again labs not consistent with DKA.   Suspect hypertension and tachycardia in the setting of her being unable to take her medications due to nausea and vomiting.  Given labetalol  with improvement.   CT abdomen pelvis was performed and evaluated by me showing no evidence of bowel obstruction, right sided kidney cysts, simple kidney cysts.  Do not see etiology of nausea and vomiting on CT>  Denies headache or neurologic changes and doubt intracranial etiology of emesis. Doubt pneumonia, uremia, or cardiac etiology of emesis.  UA with 6-10 WBC, no urinary symptoms-will send for culture in setting of emesis but does not appear consistent with infection on UA.   Had continued emesis following zofran , given reglan  with improvement and able to tolerate po.  Recommend follow up with PCP and gastroenterology.  Discussed possible symptoms may be viral gastritis/gastroenteritis however given hx of DM, prior episodes of emesis do have suspicion for gastroparesis and recommend continued outpatient follow up with strict return precautions.  Given rx for reglan . Patient discharged in stable condition with understanding of reasons to return.          Final  Clinical Impression(s) / ED Diagnoses Final diagnoses:  Nausea and vomiting, unspecified vomiting type  Hypertension, unspecified type    Rx / DC Orders ED Discharge Orders          Ordered    metoCLOPramide  (REGLAN ) 10 MG tablet  Every 6 hours PRN        08/16/23 1513              Scarlette Currier, MD 08/16/23 2152

## 2023-08-17 LAB — URINE CULTURE: Culture: >=100000 — AB

## 2023-08-18 ENCOUNTER — Telehealth (HOSPITAL_BASED_OUTPATIENT_CLINIC_OR_DEPARTMENT_OTHER): Payer: Self-pay | Admitting: Emergency Medicine

## 2023-08-18 NOTE — Telephone Encounter (Signed)
 Post ED Visit - Positive Culture Follow-up  Culture report reviewed by antimicrobial stewardship pharmacist: [facility] [address], Pharm.D. []  Skeet Duke, Pharm.D., BCPS AQ-ID []  Leslee Rase, Pharm.D., BCPS []  Garland Junk, Pharm.D., BCPS []  [place], [address].D., BCPS, AAHIVP []  Alcide Aly, Pharm.D., BCPS, AAHIVP []  Jerri Morale, PharmD, BCPS []  Graham Laws, PharmD, BCPS []  Cleda Curly, PharmD, BCPS [x]  Mohammed Andrew, PharmD []  Ballard Levels, PharmD, BCPS []  Ollen Beverage, PharmD  [facility] []  Arlyne Bering, PharmD []  Sherryle Don, PharmD []  [place], PharmD []  Delila Felty, Rph []  [place]) Cleora Daft, PharmD []  Augustina Block, PharmD []  Arie Kurtz, PharmD []  Sharlyn Deaner, PharmD []  Agnes Hose, PharmD []  Kendall Pauls, PharmD []  Gladstone Lamer, PharmD []  Armanda Bern, PharmD []  Tera Fellows, PharmD   Positive urine culture No further patient follow-up is required at this time.  Lillie Reining 08/18/2023, 12:42 PM
